# Patient Record
Sex: Female | Born: 1940 | Race: White | Hispanic: No | Smoking: Former smoker
Health system: Southern US, Community
[De-identification: ages and names within clinical notes are randomized; demographics above are authoritative.]

---

## 2017-09-09 ENCOUNTER — Ambulatory Visit (INDEPENDENT_AMBULATORY_CARE_PROVIDER_SITE_OTHER): Payer: Medicare Other | Admitting: Pulmonary Disease

## 2017-09-09 ENCOUNTER — Encounter: Payer: Self-pay | Admitting: Pulmonary Disease

## 2017-09-09 ENCOUNTER — Other Ambulatory Visit (INDEPENDENT_AMBULATORY_CARE_PROVIDER_SITE_OTHER): Payer: Medicare Other

## 2017-09-09 ENCOUNTER — Ambulatory Visit (INDEPENDENT_AMBULATORY_CARE_PROVIDER_SITE_OTHER)
Admission: RE | Admit: 2017-09-09 | Discharge: 2017-09-09 | Disposition: A | Discharge status: Home / Self Care | Payer: Medicare Other | Source: Ambulatory Visit | Attending: Pulmonary Disease | Admitting: Pulmonary Disease

## 2017-09-09 VITALS — BP 122/62 | HR 85 | Ht 67.5 in | Wt 168.0 lb

## 2017-09-09 DIAGNOSIS — R05 Cough: Secondary | ICD-10-CM

## 2017-09-09 DIAGNOSIS — J181 Lobar pneumonia, unspecified organism: Secondary | ICD-10-CM | POA: Diagnosis not present

## 2017-09-09 DIAGNOSIS — R059 Cough, unspecified: Secondary | ICD-10-CM

## 2017-09-09 LAB — CBC WITH DIFFERENTIAL/PLATELET
Basophils Absolute: 0.1 10*3/uL (ref 0.0–0.1)
Basophils Relative: 0.6 % (ref 0.0–3.0)
Eosinophils Absolute: 0.2 10*3/uL (ref 0.0–0.7)
Eosinophils Relative: 1.5 % (ref 0.0–5.0)
HCT: 45.1 % (ref 36.0–46.0)
Hemoglobin: 14.3 g/dL (ref 12.0–15.0)
Lymphocytes Relative: 8.9 % — ABNORMAL LOW (ref 12.0–46.0)
Lymphs Abs: 1.4 10*3/uL (ref 0.7–4.0)
MCHC: 31.6 g/dL (ref 30.0–36.0)
MCV: 87.4 fl (ref 78.0–100.0)
Monocytes Absolute: 1.6 10*3/uL — ABNORMAL HIGH (ref 0.1–1.0)
Monocytes Relative: 9.6 % (ref 3.0–12.0)
Neutro Abs: 12.9 10*3/uL — ABNORMAL HIGH (ref 1.4–7.7)
Neutrophils Relative %: 79.4 % — ABNORMAL HIGH (ref 43.0–77.0)
Platelets: 413 10*3/uL — ABNORMAL HIGH (ref 150.0–400.0)
RBC: 5.16 Mil/uL — ABNORMAL HIGH (ref 3.87–5.11)
RDW: 15.5 % (ref 11.5–15.5)
WBC: 16.2 10*3/uL — ABNORMAL HIGH (ref 4.0–10.5)

## 2017-09-09 LAB — NITRIC OXIDE: NITRIC OXIDE: 11

## 2017-09-09 NOTE — Addendum Note (Signed)
Addended by: Kandice HamsHOEFLER, Pheonix Wisby D on: 09/09/2017 10:46 AM   Modules accepted: Orders

## 2017-09-09 NOTE — Addendum Note (Signed)
Addended by: Sheran LuzEAST, Nyasia Baxley K on: 09/09/2017 11:06 AM   Modules accepted: Orders

## 2017-09-09 NOTE — Progress Notes (Addendum)
Scarlett PrestoFrances Carlson    [id number]    01/18/1941  Primary Care Physician:System, Pcp Not In  Referring Physician: No referring provider defined for this encounter.  Chief complaint: Follow-up after pneumonia  HPI: 77 year old with history of sinusitis, allergies.  She was hospitalized for 1 week at [facility] with hypoxia, pneumonia.  Discharged on 09/02/17.  As per the patient the hospitalization has been complicated with transient atrial fibrillation.  She was discharged on supplemental oxygen and told to follow-up with a pulmonologist.  History of seasonal allergies, sinusitis and was told she had asthma but never been on inhalers.  She has history of long-standing intermittent wheeze.  In office today she feels well with improved breathing.  Denies any cough, sputum production, fevers, chills.  She is on albuterol rescue inhaler which she is using 4 times a day as she was instructed to take this on a regular basis.  Pets: Dog, no birds or farm animals Occupation: Retired Catering managerbookkeeper Exposures: No known exposures, no mold at home Smoking history: 10-pack-year smoking history.  Quit in 1998 Travel History: Lived in [place] all her life.  [place] with her daughter.  Outpatient Encounter Medications as of 09/09/2017  Medication Sig  . diltiazem (CARDIZEM CD) 120 MG 24 hr capsule Take 120 mg by mouth daily.  Marland Kitchen. lisinopril (PRINIVIL,ZESTRIL) 10 MG tablet Take 10 mg by mouth daily.  Marland Kitchen. PARoxetine (PAXIL) 20 MG tablet Take 20 mg by mouth daily.   No facility-administered encounter medications on file as of 09/09/2017.     Allergies as of 09/09/2017 - Review Complete 09/09/2017  Allergen Reaction Noted  . Penicillins  09/09/2017    No past medical history on file.  History reviewed. No pertinent surgical history.  No family history on file.  Social History   Socioeconomic History  . Marital status: Divorced    Spouse name: Not  on file  . Number of children: Not on file  . Years of education: Not on file  . Highest education level: Not on file  Social Needs  . Financial resource strain: Not on file  . Food insecurity - worry: Not on file  . Food insecurity - inability: Not on file  . Transportation needs - medical: Not on file  . Transportation needs - non-medical: Not on file  Occupational History  . Not on file  Tobacco Use  . Smoking status: Not on file  Substance and Sexual Activity  . Alcohol use: Not on file  . Drug use: Not on file  . Sexual activity: Not on file  Other Topics Concern  . Not on file  Social History Narrative  . Not on file   Review of systems: Review of Systems  Constitutional: Negative for fever and chills.  HENT: Negative.   Eyes: Negative for blurred vision.  Respiratory: as per HPI  Cardiovascular: Negative for chest pain and palpitations.  Gastrointestinal: Negative for vomiting, diarrhea, blood per rectum. Genitourinary: Negative for dysuria, urgency, frequency and hematuria.  Musculoskeletal: Negative for myalgias, back pain and joint pain.  Skin: Negative for itching and rash.  Neurological: Negative for dizziness, tremors, focal weakness, seizures and loss of consciousness.  Endo/Heme/Allergies: Negative for environmental allergies.  Psychiatric/Behavioral: Negative for depression, suicidal ideas and hallucinations.  All other systems reviewed and are negative.  Physical Exam: Blood pressure 122/62, pulse 85, height 5' 7.5" (1.715 m), weight 168 lb (76.2 kg), SpO2 (!) 85 %.  Gen:      No acute distress HEENT:  EOMI, sclera anicteric Neck:     No masses; no thyromegaly Lungs:    Clear to auscultation bilaterally; normal respiratory effort CV:         Regular rate and rhythm; no murmurs Abd:      + bowel sounds; soft, non-tender; no palpable masses, no distension Ext:    Joint deformities of the hand. Skin:      Warm and dry; no rash Neuro: alert and oriented x  3 Psych: normal mood and affect  Data Reviewed: No records to review  FENO 09/09/17- 11  Assessment:  Follow-up after recent hospitalization for pneumonia Appears to be recovering well from recent pneumonia.  She is still hypoxic requiring supplemental oxygen We will obtain records from [facility] regional Check chest x-ray today, pulmonary function tests, FENO given her history of asthma and remote smoking history. Check CBC differential and blood allergy profile for evaluation of asthma, allergies.  I asked her to use albuterol on an as-needed basis and not scheduled.  She has joint deformities suggestive of rheumatoid arthritis although she denies any joint stiffness, swelling, pain.  Check ANA, rheumatoid factor, CCP.  Plan/Recommendations: -Chest x-ray -CBC differential, blood allergy profile, ANA, rheumatoid factor, CCP -Ambulatory oxygen assessment, FENO -Obtain records from [facility]. -Albuterol as needed  Chilton Greathouse MD [place] [facility] Pager [phone] 09/09/2017, 10:07 AM  CC: No ref. provider found

## 2017-09-09 NOTE — Patient Instructions (Signed)
We will get a chest x-ray today Check blood work including CBC differential, blood allergy profile, ANA with reflex, rheumatoid factor, CCP Schedule you for pulmonary function test We will check your oxygen levels on ambulation and FENO Follow-up in 1 month.

## 2017-09-12 LAB — CYCLIC CITRUL PEPTIDE ANTIBODY, IGG: Cyclic Citrullin Peptide Ab: <16 UNITS

## 2017-09-12 LAB — RHEUMATOID FACTOR: Rhuematoid fact SerPl-aCnc: 21 IU/mL — ABNORMAL HIGH (ref <14)

## 2017-09-23 ENCOUNTER — Telehealth: Payer: Self-pay | Admitting: Pulmonary Disease

## 2017-09-23 DIAGNOSIS — J181 Lobar pneumonia, unspecified organism: Secondary | ICD-10-CM

## 2017-09-23 NOTE — Telephone Encounter (Signed)
Okay to send an order for smaller POC.

## 2017-09-23 NOTE — Telephone Encounter (Signed)
Spoke with Inetta Fermoina, she is requesting a smaller POC for pt because the other tanks are too heavy. [facility]? PM please advise.

## 2017-09-23 NOTE — Telephone Encounter (Signed)
Order placed for pt to have a smaller poc. Nothing further needed at this current time.

## 2017-09-26 ENCOUNTER — Telehealth: Payer: Self-pay | Admitting: Pulmonary Disease

## 2017-09-26 DIAGNOSIS — J181 Lobar pneumonia, unspecified organism: Secondary | ICD-10-CM

## 2017-09-26 NOTE — Telephone Encounter (Signed)
[facility] [place] [facility] Per Barbara CowerJason, their last documentation is that pt is on 4lpm so a SimplyGo Mini would likely not be appropriate for pt  However, per the 1.25.19 phone note pt's emergency contact Sheralyn Boatmanoni stated that pt's liter flow was decreased from 4lpm to 2lpm at the 1.11.19 office visit.  Per the 1.11.19 office note pt was walked on 2lpm at 90-91% but there was no order placed to [facility] adjusting pt's liter flow.  Dr Isaiah SergeMannam, per that 1.25.19 phone note you did okay the smaller portable O2 but there was no request to you about decreasing patient's liter flow.  If this is okay will place new O2 order for the 2lpm with activity and request pt to be evaluated and titrated for best-fit POC.  Please advise, thank you.

## 2017-09-26 NOTE — Telephone Encounter (Signed)
Ok to place order for 2 lt with activity and titrate for POC. Thanks

## 2017-09-26 NOTE — Telephone Encounter (Signed)
Order placed for POC at 2L. Nothing further is needed.

## 2017-10-10 ENCOUNTER — Encounter: Payer: Self-pay | Admitting: Pulmonary Disease

## 2017-10-10 ENCOUNTER — Other Ambulatory Visit: Payer: Medicare Other

## 2017-10-10 ENCOUNTER — Ambulatory Visit (INDEPENDENT_AMBULATORY_CARE_PROVIDER_SITE_OTHER): Payer: Medicare Other | Admitting: Pulmonary Disease

## 2017-10-10 VITALS — BP 132/62 | HR 93 | Ht 65.0 in | Wt 168.0 lb

## 2017-10-10 DIAGNOSIS — R0602 Shortness of breath: Secondary | ICD-10-CM

## 2017-10-10 DIAGNOSIS — J449 Chronic obstructive pulmonary disease, unspecified: Secondary | ICD-10-CM | POA: Diagnosis not present

## 2017-10-10 DIAGNOSIS — J181 Lobar pneumonia, unspecified organism: Secondary | ICD-10-CM

## 2017-10-10 LAB — PULMONARY FUNCTION TEST
DL/VA % pred: 98 %
DL/VA: 4.84 ml/min/mmHg/L
DLCO COR % PRED: 52 %
DLCO COR: 13.33 ml/min/mmHg
DLCO unc % pred: 53 %
DLCO unc: 13.69 ml/min/mmHg
FEF 25-75 Post: 0.32 L/sec
FEF 25-75 Pre: 0.26 L/sec
FEF2575-%Change-Post: 21 %
FEF2575-%Pred-Post: 19 %
FEF2575-%Pred-Pre: 16 %
FEV1-%Change-Post: 9 %
FEV1-%Pred-Post: 35 %
FEV1-%Pred-Pre: 32 %
FEV1-POST: 0.76 L
FEV1-Pre: 0.69 L
FEV1FVC-%Change-Post: 5 %
FEV1FVC-%Pred-Pre: 67 %
FEV6-%Change-Post: 4 %
FEV6-%PRED-PRE: 49 %
FEV6-%Pred-Post: 51 %
FEV6-POST: 1.39 L
FEV6-Pre: 1.34 L
FEV6FVC-%Change-Post: 0 %
FEV6FVC-%PRED-POST: 102 %
FEV6FVC-%PRED-PRE: 101 %
FVC-%Change-Post: 3 %
FVC-%PRED-PRE: 48 %
FVC-%Pred-Post: 50 %
FVC-POST: 1.44 L
FVC-PRE: 1.39 L
POST FEV6/FVC RATIO: 97 %
PRE FEV1/FVC RATIO: 50 %
PRE FEV6/FVC RATIO: 96 %
Post FEV1/FVC ratio: 53 %

## 2017-10-10 MED ORDER — UMECLIDINIUM-VILANTEROL 62.5-25 MCG/INH IN AEPB: 1.0000 | INHALATION_SPRAY | Freq: Every day | RESPIRATORY_TRACT | 5 refills | Status: DC

## 2017-10-10 MED ORDER — UMECLIDINIUM-VILANTEROL 62.5-25 MCG/INH IN AEPB: 1.0000 | INHALATION_SPRAY | Freq: Every day | RESPIRATORY_TRACT | 0 refills | Status: AC

## 2017-10-10 NOTE — Progress Notes (Signed)
PFT done today. 

## 2017-10-10 NOTE — Addendum Note (Signed)
Addended by: Maxwell MarionBLANKENSHIP, Dovie Kapusta A on: 10/10/2017 03:03 PM   Modules accepted: Orders

## 2017-10-10 NOTE — Progress Notes (Signed)
Melanie PrestoFrances Webster    [id number]    24-Jul-1941  Primary Care Physician:[facility]  Referring Physician: No referring provider defined for this encounter.  Chief complaint: Follow-up after pneumonia  HPI: 77 year old with history of sinusitis, allergies.  She was hospitalized for 1 week at [facility] with hypoxia, pneumonia.  Discharged on 09/02/17.  As per the patient the hospitalization has been complicated with transient atrial fibrillation.  She was discharged on supplemental oxygen and told to follow-up with a pulmonologist.  History of seasonal allergies, sinusitis and was told she had asthma but never been on inhalers.  She has history of long-standing intermittent wheeze.  In office today she feels well with improved breathing.  Denies any cough, sputum production, fevers, chills.  She is on albuterol rescue inhaler which she is using 4 times a day as she was instructed to take this on a regular basis.  Pets: Dog, no birds or farm animals Occupation: Retired Catering managerbookkeeper Exposures: No known exposures, no mold at home Smoking history: 10-pack-year smoking history.  Quit in 1998 Travel History: Lived in [place] all her life.  [place] live with her daughter.  Interim history: No change in symptoms.  She has had a chest x-ray at her primary care office 2 weeks ago which showed clear lungs with no residual infiltrate.  Outpatient Encounter Medications as of 10/10/2017  Medication Sig  . diltiazem (CARDIZEM CD) 120 MG 24 hr capsule Take 120 mg by mouth daily.  Marland Kitchen. lisinopril (PRINIVIL,ZESTRIL) 10 MG tablet Take 10 mg by mouth daily.  Marland Kitchen. PARoxetine (PAXIL) 20 MG tablet Take 20 mg by mouth daily.  . VENTOLIN HFA 108 (90 Base) MCG/ACT inhaler 2 puffs every 4 (four) hours.   No facility-administered encounter medications on file as of 10/10/2017.     Allergies as of 10/10/2017 - Review Complete 10/10/2017  Allergen  Reaction Noted  . Penicillins  09/09/2017    No past medical history on file.  No past surgical history on file.  No family history on file.  Social History   Socioeconomic History  . Marital status: Divorced    Spouse name: Not on file  . Number of children: Not on file  . Years of education: Not on file  . Highest education level: Not on file  Social Needs  . Financial resource strain: Not on file  . Food insecurity - worry: Not on file  . Food insecurity - inability: Not on file  . Transportation needs - medical: Not on file  . Transportation needs - non-medical: Not on file  Occupational History  . Not on file  Tobacco Use  . Smoking status: Former Smoker    Types: Cigarettes    Last attempt to quit: 09/09/1996    Years since quitting: 21.0  . Smokeless tobacco: Never Used  Substance and Sexual Activity  . Alcohol use: Not on file  . Drug use: Not on file  . Sexual activity: Not on file  Other Topics Concern  . Not on file  Social History Narrative  . Not on file   Review of systems: Review of Systems  Constitutional: Negative for fever and chills.  HENT: Negative.   Eyes: Negative for blurred vision.  Respiratory: as per HPI  Cardiovascular: Negative for chest pain and palpitations.  Gastrointestinal: Negative for vomiting, diarrhea, blood per rectum. Genitourinary: Negative for dysuria, urgency, frequency and hematuria.  Musculoskeletal: Negative for  myalgias, back pain and joint pain.  Skin: Negative for itching and rash.  Neurological: Negative for dizziness, tremors, focal weakness, seizures and loss of consciousness.  Endo/Heme/Allergies: Negative for environmental allergies.  Psychiatric/Behavioral: Negative for depression, suicidal ideas and hallucinations.  All other systems reviewed and are negative.  Physical Exam: Blood pressure 122/62, pulse 85, height 5' 7.5" (1.715 m), weight 168 lb (76.2 kg), SpO2 (!) 85 %. Gen:      No acute  distress HEENT:  EOMI, sclera anicteric Neck:     No masses; no thyromegaly Lungs:    Clear to auscultation bilaterally; normal respiratory effort CV:         Regular rate and rhythm; no murmurs Abd:      + bowel sounds; soft, non-tender; no palpable masses, no distension Ext:    Joint deformities of the hand. Skin:      Warm and dry; no rash Neuro: alert and oriented x 3 Psych: normal mood and affect  Data Reviewed: CT chest 08/27/17- Patchy bilateral infiltrates consistent with pneumonia. Chest x-ray 09/09/17-partial clearing of right lower lobe infiltrate I have reviewed the images personally.  Chest x-ray [facility] 09/22/17-no infiltrate, clear lungs.  No acute cardiopulmonary disease.  FENO 09/09/17- 11  PFTs 10/10/17 FVC 1.44 [50%], FEV1 0.76 [35%], F/F 53, TLC 64%, DLCO 53% Severe obstruction, restriction in diffusion defect.  CBC 09/09/17-WBC 16.2, eosinophils 1.5%, absolute eosinophil count 200  Assessment:  Recent hospitalization for pneumonia Appears to be recovering well from recent pneumonia.  Chest x-ray shows clearing of left lung infiltrate  Severe COPD PFTs reviewed.  They show severe obstruction which is surprising as she does not have a significant smoking history Check alpha-1 antitrypsin levels and phenotype.  Start Anoro and use albuterol as needed  She has been told that she has asthma in the past but her symptoms are not very typical.  Check blood allergy profile. Recheck her oxygen levels on exertion.   She has joint deformities suggestive of rheumatoid arthritis although she denies any joint stiffness, swelling, pain.  There is mild elevation in rheumatoid factor which is likely nonspecific.  CCP is negative.  ANA is pending. We will continue to monitor  Plan/Recommendations: -Check blood allergy profile, ANA, A1AT levels and phenotype.  -Ambulatory oxygen assessment. -Albuterol as needed. Start anoro inhaler.   Chilton Greathouse MD [place] [facility] Pager [phone] 10/10/2017, 2:13 PM  CC: No ref. provider found

## 2017-10-10 NOTE — Patient Instructions (Addendum)
Check blood allergy profile, ANA with reflex, A1AT levels and phenotype  We will give samples of Anoro and give prescription. Continue albuterol as needed We will check your oxygen levels on exertion today Follow-up in 1-2 months.

## 2017-10-11 LAB — ANA W/REFLEX: Anti Nuclear Antibody(ANA): NEGATIVE

## 2017-10-13 LAB — RESPIRATORY ALLERGY PROFILE REGION II ~~LOC~~
Allergen, A. alternata, m6: <0.1 kU/L
Allergen, Cedar tree, t12: <0.1 kU/L
Allergen, Comm Silver Birch, t9: <0.1 kU/L
Allergen, D pternoyssinus,d7: <0.1 kU/L
Allergen, Mouse Urine Protein, e78: <0.1 kU/L
Allergen, Mulberry, t76: <0.1 kU/L
Allergen, P. notatum, m1: <0.1 kU/L
CLADOSPORIUM HERBARUM (M2) IGE: <0.1 kU/L
CLASS: 0
CLASS: 0
CLASS: 0
CLASS: 0
CLASS: 0
CLASS: 0
CLASS: 0
COMMON RAGWEED (SHORT) (W1) IGE: <0.1 kU/L
Cat Dander: <0.1 kU/L
Class: 0
Class: 0
Class: 0
Class: 0
Class: 0
Class: 0
Class: 0
Class: 0
Class: 0
Class: 0
Class: 0
Class: 0
Class: 0
Class: 0
Class: 0
Class: 0
Class: 0
D. farinae: <0.1 kU/L
IGE (IMMUNOGLOBULIN E), SERUM: 5 kU/L (ref <=114)
Pecan/Hickory Tree IgE: <0.1 kU/L
Rough Pigweed  IgE: <0.1 kU/L
Timothy Grass: <0.1 kU/L

## 2017-10-13 LAB — ALPHA-1 ANTITRYPSIN PHENOTYPE: A-1 Antitrypsin, Ser: 191 mg/dL (ref 83–199)

## 2017-10-13 LAB — INTERPRETATION:

## 2017-10-19 ENCOUNTER — Telehealth: Payer: Self-pay | Admitting: Pulmonary Disease

## 2017-10-19 MED ORDER — UMECLIDINIUM-VILANTEROL 62.5-25 MCG/INH IN AEPB: 1.0000 | INHALATION_SPRAY | Freq: Every day | RESPIRATORY_TRACT | 11 refills | Status: DC

## 2017-10-19 NOTE — Telephone Encounter (Signed)
Pt called concerned that her copay for Anoro is very high, requesting recs.  I called pt's pharmacy who verified that Anoro is covered by pt's insurance but she just has a very high copay for inhalers d/t needing to meet a deductible.  Called to make pt aware.  Pt requests application for financial assistance through manufacturer.  Form printed and filled out to best of my ability.  Form placed in PM's cubby with flags for him to sign.  Form will need to be mailed to patient once signed- verified PO box address on file.    Will route to [facility] to follow up on signatures from PM.

## 2017-10-21 NOTE — Telephone Encounter (Signed)
Forms have been signed by Dr. Isaiah SergeMannam to mailed to address on file- verified by pt's daughter. Pt's daughter, Inetta Fermoina is aware and voiced her understanding.  Nothing further is needed

## 2017-11-09 ENCOUNTER — Telehealth: Payer: Self-pay | Admitting: Pulmonary Disease

## 2017-11-09 NOTE — Telephone Encounter (Signed)
Attempted to contact pt. No answer, no option to leave a message. Will try back.  

## 2017-11-09 NOTE — Telephone Encounter (Signed)
Try bevespi 

## 2017-11-09 NOTE — Telephone Encounter (Signed)
Called and spoke with pt who states she has been paying $356 out of pocket and she stated her insurance only covers about $100.  Pt is wanting to switch to a cheaper inhaler than the Anoro due to cost.  Alternatives to Anoro are advair, breo, incruse, spiriva respimat, symbicort, stiolto, or bevespi.  Dr. Isaiah SergeMannam, please advise an inhaler you want us to switch pt to other than anoro. Thanks!

## 2017-11-10 MED ORDER — GLYCOPYRROLATE-FORMOTEROL 9-4.8 MCG/ACT IN AERO: 2.0000 | INHALATION_SPRAY | Freq: Two times a day (BID) | RESPIRATORY_TRACT | 5 refills | Status: DC

## 2017-11-10 NOTE — Telephone Encounter (Signed)
Patient daughter Inetta Fermoina returning call - she can be reached at [phone] -pr

## 2017-11-10 NOTE — Telephone Encounter (Signed)
Called patients daughter, unable to reach left message to give us a call back in regards to mothers medication.

## 2017-11-10 NOTE — Telephone Encounter (Signed)
Patient called back - she would like us to call her daughter back Inetta Fermo- Tina [phone]-pr

## 2017-11-10 NOTE — Telephone Encounter (Signed)
Spoke with pt's daughter, Inetta Fermoina per the pt's request. She is aware of the medication change. Rx has been sent in. Nothing further was needed.

## 2017-12-05 ENCOUNTER — Ambulatory Visit (INDEPENDENT_AMBULATORY_CARE_PROVIDER_SITE_OTHER): Payer: Medicare Other | Admitting: Pulmonary Disease

## 2017-12-05 ENCOUNTER — Encounter: Payer: Self-pay | Admitting: Pulmonary Disease

## 2017-12-05 VITALS — BP 130/76 | HR 70 | Ht 67.0 in | Wt 176.6 lb

## 2017-12-05 DIAGNOSIS — J449 Chronic obstructive pulmonary disease, unspecified: Secondary | ICD-10-CM | POA: Diagnosis not present

## 2017-12-05 MED ORDER — TIOTROPIUM BROMIDE-OLODATEROL 2.5-2.5 MCG/ACT IN AERS: 2.0000 | INHALATION_SPRAY | Freq: Every day | RESPIRATORY_TRACT | 5 refills | Status: AC

## 2017-12-05 NOTE — Progress Notes (Signed)
Melanie Webster    [id number]    [birth year]  Primary Care Physician:[facility]  [facility] [place] [address] [place], [place] [id number]  Chief complaint: Follow-up after pneumonia  HPI: 77 year old with history of sinusitis, allergies.  She was hospitalized for 1 week at [facility] with hypoxia, pneumonia.  Discharged on 09/02/17.  As per the patient the hospitalization has been complicated with transient atrial fibrillation.  She was discharged on supplemental oxygen and told to follow-up with a pulmonologist.  History of seasonal allergies, sinusitis and was told she had asthma but never been on inhalers.  She has history of long-standing intermittent wheeze.  In office today she feels well with improved breathing.  Denies any cough, sputum production, fevers, chills.  She is on albuterol rescue inhaler which she is using 4 times a day as she was instructed to take this on a regular basis.  Pets: Dog, no birds or farm animals Occupation: Retired Catering managerbookkeeper Exposures: No known exposures, no mold at home Smoking history: 10-pack-year smoking history.  Quit in 1998 Travel History: Lived in [place] all her life.  [place] to live with her daughter.  Interim history: Started on anoro at last visit however this was not covered by insurance hence switched to bevespi.  She feels that bevespi does not work as well for her Has chronic dyspnea on exertion, denies cough, sputum production, fevers, chills.  Outpatient Encounter Medications as of 12/05/2017  Medication Sig  . diltiazem (CARDIZEM CD) 120 MG 24 hr capsule Take 120 mg by mouth daily.  . Glycopyrrolate-Formoterol (BEVESPI AEROSPHERE) 9-4.8 MCG/ACT AERO Inhale 2 puffs into the lungs 2 (two) times daily.  Marland Kitchen. lisinopril (PRINIVIL,ZESTRIL) 10 MG tablet Take 10 mg by mouth daily.  Marland Kitchen. PARoxetine (PAXIL) 20 MG tablet Take  20 mg by mouth daily.  . VENTOLIN HFA 108 (90 Base) MCG/ACT inhaler 2 puffs every 4 (four) hours.  . [DISCONTINUED] umeclidinium-vilanterol (ANORO ELLIPTA) 62.5-25 MCG/INH AEPB Inhale 1 puff into the lungs daily.   No facility-administered encounter medications on file as of 12/05/2017.     Allergies as of 12/05/2017 - Review Complete 12/05/2017  Allergen Reaction Noted  . Penicillins  09/09/2017    No past medical history on file.  No past surgical history on file.  No family history on file.  Social History   Socioeconomic History  . Marital status: Divorced    Spouse name: Not on file  . Number of children: Not on file  . Years of education: Not on file  . Highest education level: Not on file  Occupational History  . Not on file  Social Needs  . Financial resource strain: Not on file  . Food insecurity:    Worry: Not on file    Inability: Not on file  . Transportation needs:    Medical: Not on file    Non-medical: Not on file  Tobacco Use  . Smoking status: Former Smoker    Types: Cigarettes    Last attempt to quit: 09/09/1996    Years since quitting: 21.2  . Smokeless tobacco: Never Used  Substance and Sexual Activity  . Alcohol use: Not on file  . Drug use: Not on file  . Sexual activity: Not on file  Lifestyle  . Physical activity:    Days per week: Not on file    Minutes per session: Not on file  .  Stress: Not on file  Relationships  . Social connections:    Talks on phone: Not on file    Gets together: Not on file    Attends religious service: Not on file    Active member of club or organization: Not on file    Attends meetings of clubs or organizations: Not on file    Relationship status: Not on file  . Intimate partner violence:    Fear of current or ex partner: Not on file    Emotionally abused: Not on file    Physically abused: Not on file    Forced sexual activity: Not on file  Other Topics Concern  . Not on file  Social History Narrative    . Not on file   Review of systems: Review of Systems  Constitutional: Negative for fever and chills.  HENT: Negative.   Eyes: Negative for blurred vision.  Respiratory: as per HPI  Cardiovascular: Negative for chest pain and palpitations.  Gastrointestinal: Negative for vomiting, diarrhea, blood per rectum. Genitourinary: Negative for dysuria, urgency, frequency and hematuria.  Musculoskeletal: Negative for myalgias, back pain and joint pain.  Skin: Negative for itching and rash.  Neurological: Negative for dizziness, tremors, focal weakness, seizures and loss of consciousness.  Endo/Heme/Allergies: Negative for environmental allergies.  Psychiatric/Behavioral: Negative for depression, suicidal ideas and hallucinations.  All other systems reviewed and are negative.  Physical Exam: Blood pressure 130/76, pulse 70, height 5\' 7"  (1.702 m), weight 176 lb 9.6 oz (80.1 kg), SpO2 95 %. Gen:      No acute distress HEENT:  EOMI, sclera anicteric Neck:     No masses; no thyromegaly Lungs:    Clear to auscultation bilaterally; normal respiratory effort CV:         Regular rate and rhythm; no murmurs Abd:      + bowel sounds; soft, non-tender; no palpable masses, no distension Ext:    No edema; adequate peripheral perfusion Skin:      Warm and dry; no rash Neuro: alert and oriented x 3 Psych: normal mood and affect  Data Reviewed: CT chest 08/27/17- Patchy bilateral infiltrates consistent with pneumonia. Chest x-ray 09/09/17-partial clearing of right lower lobe infiltrate Chest x-ray 09/21/17-clear lungs with no infiltrate. Reviewed the images personally.  Chest x-ray [facility]] 09/22/17-no infiltrate, clear lungs.  No acute cardiopulmonary disease.  FENO 09/09/17- 11  PFTs 10/10/17 FVC 1.44 [50%], FEV1 0.76 [35%], F/F 53, TLC 64%, DLCO 53% Severe obstruction, restriction in diffusion defect.  Labs CBC 09/09/17-WBC 16.2, eosinophils 1.5%, absolute eosinophil count 200 Blood allergy  profile 10/10/17-IgE 5, RAST panel negative  Alpha-1 antitrypsin 10/10/17-191, PIMM  Connective tissue serologies 10/10/17 ANA negative, CCP < 16.  Rheumatoid factor 21  Assessment:  Recent hospitalization for pneumonia Appears to be recovering well from recent pneumonia.  Chest x-ray shows clearing of left lung infiltrate  Severe COPD PFTs reviewed.  They show severe obstruction which is surprising as she does not have a significant smoking history Alpha-1 antitrypsin levels are normal She has been told that she has asthma in the past but her symptoms are not very typical.   Continue inhaler therapy with LABA/Lama combination.  Switch bevespi to stiolto she feels bevespi is not working as well.  She has joint deformities suggestive of rheumatoid arthritis although she denies any joint stiffness, swelling, pain.  There is mild elevation in rheumatoid factor which is likely nonspecific.  CCP and ANA is negative.  We will continue to monitor this.  Plan/Recommendations: - Stop bevespi.  Will start stiolto - Albuterol as needed.  Follow-up in 6 months.  Chilton Greathouse MD  Pulmonary and Critical Care Pager [phone] 12/05/2017, 4:25 PM  CC: [facility]

## 2017-12-05 NOTE — Patient Instructions (Addendum)
I am sorry that the bevespi inhaler is not working as well for you We will try a different inhaler called stiolto. Please let us know if this works better for you. Follow up in 6 months

## 2017-12-07 ENCOUNTER — Telehealth: Payer: Self-pay | Admitting: Pulmonary Disease

## 2017-12-07 NOTE — Telephone Encounter (Signed)
PA request received from [employer] [place] CMM Key: [id number] PA request has been sent to plan, and a determination is expected within 5 business days.   Routing to [place] for follow-up.

## 2017-12-09 NOTE — Telephone Encounter (Signed)
Received letter stating that the Jaynie CrumbleStiolto has been approved from 08/28/17 until 08/29/18 Letter faxed to [facility] in [place] to make them aware

## 2018-02-22 ENCOUNTER — Telehealth: Payer: Self-pay | Admitting: Pulmonary Disease

## 2018-02-22 NOTE — Telephone Encounter (Signed)
Called and spoke with pt's daughter regarding her handicap sticker forms. Advised her that the forms are in Dr. Shirlee MoreMannam's cubby to be completed and signed. They have not been signed at this time.  [facility].

## 2018-02-24 NOTE — Telephone Encounter (Signed)
Spoke with Melanie Webster still has form and will return once signed.

## 2018-02-27 NOTE — Telephone Encounter (Signed)
Called and spoke with pt's daughter-Tina regarding handicap forms. They are signed and completed by Dr. Isaiah SergeMannam.  Pt's daughter requested the forms be mailed to her this week. Placed forms in the mail to [address] [place] [place] [phone] today. Nothing further needed.

## 2018-05-23 ENCOUNTER — Telehealth: Payer: Self-pay | Admitting: Pulmonary Disease

## 2018-05-23 NOTE — Telephone Encounter (Signed)
Received call from [facility] Patient seen there for COPD exacerbation.  She had a CT scan that showed a new right lower lobe 16 mm spiculated nodule that will need follow-up  We will make clinic visit ASAP evaluate I have requested the ED physician to ensure she has the CT scan on a disc for us to review.  Chilton GreathousePraveen Gresia Isidoro MD [place] [facility] 05/23/2018, 2:26 PM

## 2018-05-23 NOTE — Telephone Encounter (Signed)
Called and spoke with patients daughter, patient has been scheduled for 05/29/18 at 10:00.

## 2018-05-26 ENCOUNTER — Telehealth: Payer: Self-pay

## 2018-05-26 NOTE — Telephone Encounter (Signed)
Pt has been scheduled for OV on 05/29/18 with Dr. Isaiah Serge. nothing further is needed.

## 2018-05-29 ENCOUNTER — Encounter: Payer: Self-pay | Admitting: Pulmonary Disease

## 2018-05-29 ENCOUNTER — Ambulatory Visit (INDEPENDENT_AMBULATORY_CARE_PROVIDER_SITE_OTHER): Payer: Medicare Other | Admitting: Pulmonary Disease

## 2018-05-29 DIAGNOSIS — Z23 Encounter for immunization: Secondary | ICD-10-CM | POA: Diagnosis not present

## 2018-05-29 DIAGNOSIS — R911 Solitary pulmonary nodule: Secondary | ICD-10-CM

## 2018-05-29 NOTE — Addendum Note (Signed)
Addended by: Maxwell Marion A on: 05/29/2018 10:38 AM   Modules accepted: Orders

## 2018-05-29 NOTE — Patient Instructions (Addendum)
I have reviewed your CT scan which shows a new lung nodule in the right side We will get a PET scan for further evaluation We will give a flu shot today We will assess you for portable concentrator Follow-up in 2 weeks for review of scan.

## 2018-05-29 NOTE — Progress Notes (Signed)
Melanie Webster    [id number]    [birth year]  Primary Care Physician:[facility]  Referring Physician: [facility], [place] [address] [place], [place] [address]  Chief complaint: Follow-up after pneumonia  HPI: 77 year old with history of sinusitis, allergies.  She was hospitalized for 1 week at [facility] with hypoxia, pneumonia.  Discharged on 09/02/17.  As per the patient the hospitalization has been complicated with transient atrial fibrillation.  She was discharged on supplemental oxygen and told to follow-up with a pulmonologist.  History of seasonal allergies, sinusitis and was told she had asthma but never been on inhalers.  She has history of long-standing intermittent wheeze.  In office today she feels well with improved breathing.  Denies any cough, sputum production, fevers, chills.  She is on albuterol rescue inhaler which she is using 4 times a day as she was instructed to take this on a regular basis.  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  Quit in 1998 Travel History: Lived in [place] all her life.  Moved to [facility] 2018 to live with her daughter.  Interim history: Started on anoro at last visit however this was not covered by insurance hence switched to bevespi.  She feels that bevespi does not work as well for her  At last visit this was changed to stiolto work better  Seen in the ED on 9/24 with COPD exacerbation treated with Z-Pak and prednisone CT chest at that time showed a new right lower lobe nodule  Returns to clinic for further evaluation.  States that her breathing is back to baseline after her ED visit Has chronic dyspnea on exertion, denies any cough, sputum production, fevers, chills.   Outpatient Encounter Medications as of 05/29/2018  Medication  Sig  . diltiazem (CARDIZEM CD) 120 MG 24 hr capsule Take 120 mg by mouth daily.  . Glycopyrrolate-Formoterol (BEVESPI AEROSPHERE) 9-4.8 MCG/ACT AERO Inhale 2 puffs into the lungs 2 (two) times daily.  Marland Kitchen lisinopril (PRINIVIL,ZESTRIL) 10 MG tablet Take 10 mg by mouth daily.  Marland Kitchen PARoxetine (PAXIL) 20 MG tablet Take 20 mg by mouth daily.  . Tiotropium Bromide-Olodaterol (STIOLTO RESPIMAT) 2.5-2.5 MCG/ACT AERS Inhale 2 puffs into the lungs daily.  . VENTOLIN HFA 108 (90 Base) MCG/ACT inhaler 2 puffs every 4 (four) hours.   No facility-administered encounter medications on file as of 05/29/2018.    Physical Exam: Blood pressure 128/76, pulse 78, height 5\' 7"  (1.702 m), weight 172 lb (78 kg), SpO2 92 %. Gen:      No acute distress HEENT:  EOMI, sclera anicteric Neck:     No masses; no thyromegaly Lungs:    Clear to auscultation bilaterally; normal respiratory effort CV:         Regular rate and rhythm; no murmurs Abd:      + bowel sounds; soft, non-tender; no palpable masses, no distension Ext:    No edema; adequate peripheral perfusion Skin:      Warm and dry; no rash Neuro: alert and oriented x 3 Psych: normal mood and affect  Data Reviewed: CT chest 08/27/17- Patchy bilateral infiltrates consistent with pneumonia. Chest x-ray 09/09/17-partial clearing of right lower lobe infiltrate Chest x-ray 09/21/17-clear lungs with no infiltrate. Chest x-ray [facility]] 09/22/17-no infiltrate, clear lungs.  No acute cardiopulmonary disease. CT chest 05/23/2018- spiculated right lower lobe nodule,  16 mm.  Scattered interstitial groundglass opacities in the upper lobe. I have reviewed the images personally.  PFTs 10/10/17 FVC 1.44 [50%], FEV1 0.76 [35%], F/F 53, TLC 64%, DLCO 53% Severe obstruction, restriction in diffusion defect.  FENO 09/09/17- 11  Labs CBC 09/09/17-WBC 16.2, eosinophils 1.5%, absolute eosinophil count 200 Blood allergy profile 10/10/17-IgE 5, RAST panel negative  Alpha-1  antitrypsin 10/10/17-191, PIMM  Connective tissue serologies 10/10/17 ANA negative, CCP < 16.  Rheumatoid factor 21  Assessment:  Right lower lobe nodule I reviewed the CT scan which shows spiculated right lower lobe nodule which is new since December 2018.  This is concerning for malignancy We will get a PET scan for further evaluation  She is clear that she does not want chemotherapy.  I cautioned that this is to early to discuss this.  We reviewed options for biopsy and treatment after PET scan.  Severe COPD PFTs reviewed.  They show severe obstruction which is surprising as she does not have a significant smoking history Alpha-1 antitrypsin levels are normal She has been told that she has asthma in the past but her symptoms are not very typical.   Continue inhaler therapy with LABA/Lama combination with Stiolto.  She has joint deformities suggestive of rheumatoid arthritis although she denies any joint stiffness, swelling, pain.  There is mild elevation in rheumatoid factor which is likely nonspecific.  CCP and ANA is negative.  We will continue to monitor this.  Plan/Recommendations: - PET Scan - Continue Stiolto.  Follow-up in 2 weeks to review scans . More then 1/2 the time of the 40 min visit was spent in counseling and/or coordination of care with the patient and family.  Chilton Greathouse MD [place] [facility] Pager [phone] 05/29/2018, 10:11 AM  CC: [facility]

## 2018-06-05 ENCOUNTER — Encounter (HOSPITAL_COMMUNITY): Payer: Self-pay | Admitting: Radiology

## 2018-06-05 ENCOUNTER — Ambulatory Visit (HOSPITAL_COMMUNITY)
Admission: RE | Admit: 2018-06-05 | Discharge: 2018-06-05 | Disposition: A | Discharge status: Home / Self Care | Payer: Medicare Other | Source: Ambulatory Visit | Attending: Pulmonary Disease | Admitting: Pulmonary Disease

## 2018-06-05 ENCOUNTER — Other Ambulatory Visit: Payer: Self-pay | Admitting: Pulmonary Disease

## 2018-06-05 DIAGNOSIS — I700 Atherosclerosis of aorta: Secondary | ICD-10-CM | POA: Diagnosis not present

## 2018-06-05 DIAGNOSIS — R911 Solitary pulmonary nodule: Secondary | ICD-10-CM

## 2018-06-05 DIAGNOSIS — R918 Other nonspecific abnormal finding of lung field: Secondary | ICD-10-CM

## 2018-06-05 LAB — GLUCOSE, CAPILLARY: GLUCOSE-CAPILLARY: 112 mg/dL — AB (ref 70–99)

## 2018-06-05 MED ORDER — FLUDEOXYGLUCOSE F - 18 (FDG) INJECTION
8.5000 | Freq: Once | INTRAVENOUS | Status: AC | PRN
  Administered 2018-06-05: 8.5 via INTRAVENOUS

## 2018-06-12 ENCOUNTER — Ambulatory Visit: Payer: Medicare Other | Admitting: Pulmonary Disease

## 2018-09-04 ENCOUNTER — Telehealth: Payer: Self-pay | Admitting: Pulmonary Disease

## 2018-09-04 NOTE — Telephone Encounter (Signed)
CT order printed, signed, and [facility] below listed fax [facility].  Larita FifeLynn at [facility] is aware.  Nothing further needed.

## 2019-02-10 ENCOUNTER — Telehealth: Payer: Self-pay | Admitting: Pulmonary Disease

## 2019-02-10 DIAGNOSIS — R911 Solitary pulmonary nodule: Secondary | ICD-10-CM

## 2019-02-10 NOTE — Telephone Encounter (Signed)
CT scan report from [place] dated 09/05/18 Nodular Area of Consolidation in the Right Lower Lobe Has Almost Completely Resolved.  Stable 16 Mm Subpleural Right Lower Lobe Pulmonary Nodule. Patchy Groundglass Opacities in Both Lungs Have Completely Resolved Emphysematous changes  Schedule regular follow up in clinic for COPD Order follow up CT without contrast in 6 months for lung nodule  Marshell Garfinkel MD  Pulmonary and Critical Care 02/10/2019, 1:19 PM

## 2019-02-13 NOTE — Addendum Note (Signed)
Addended by: Parke Poisson E on: 02/13/2019 09:48 AM   Modules accepted: Orders

## 2019-02-13 NOTE — Telephone Encounter (Signed)
Called spoke with patient's daughter Otila Kluver (dpr on file) and discussed CT results from [place] as stated by Dr Vaughan Browner Appt scheduled in office for 6.29.2020 @ 1615 with Dr. Vaughan Browner CT Chest w/o contrast ordered for 6 months out  Nothing further needed; will sign off

## 2019-02-26 ENCOUNTER — Encounter: Payer: Self-pay | Admitting: Pulmonary Disease

## 2019-02-26 ENCOUNTER — Other Ambulatory Visit: Payer: Self-pay

## 2019-02-26 ENCOUNTER — Ambulatory Visit (INDEPENDENT_AMBULATORY_CARE_PROVIDER_SITE_OTHER): Payer: Medicare Other | Admitting: Pulmonary Disease

## 2019-02-26 VITALS — BP 142/64 | HR 83 | Temp 98.2°F | Ht 66.0 in | Wt 162.4 lb

## 2019-02-26 DIAGNOSIS — R911 Solitary pulmonary nodule: Secondary | ICD-10-CM

## 2019-02-26 MED ORDER — STIOLTO RESPIMAT 2.5-2.5 MCG/ACT IN AERS: 2.0000 | INHALATION_SPRAY | Freq: Every day | RESPIRATORY_TRACT | 0 refills | Status: DC

## 2019-02-26 NOTE — Progress Notes (Signed)
Melanie PrestoFrances Bowmer    [id number]    08-Sep-1940  Primary Care Physician:[facility]  [facility] [place] [address] [place],  [place] [id number]  Chief complaint: Follow-up for COPD, lung nodule  HPI: 78 year old with history of sinusitis, allergies, COPD, lung nodule.    She was hospitalized for 1 week [facility] with hypoxia, pneumonia.  Discharged on 09/02/17.  As per the patient the hospitalization has been complicated with transient atrial fibrillation.  History of seasonal allergies, sinusitis.  She has history of long-standing intermittent wheeze.  Tried on multiple inhalers.  Anoro was not covered by insurance.  Bevespi did not work well for her.  Currently on Stiolto  Noted to have a right lower lobe lung nodule in September 2019 which is followed up with PET scan and CT which showed improving size of nodule..  Pets: Dog, no birds or farm animals Occupation: Retired Catering managerbookkeeper Exposures: No known exposures, no mold at home Smoking history: 10-pack-year smoking history.  Quit in 1998 Travel History: Lived in [place] all her life.  [place] [facility] [place] to live with her daughter.  Interim history: Continues on Stiolto.  However she is unable to afford it due to high cost On supplemental oxygen.  Outpatient Encounter Medications as of 02/26/2019  Medication Sig  . diltiazem (CARDIZEM CD) 120 MG 24 hr capsule Take 120 mg by mouth daily.  Marland Kitchen. lisinopril (PRINIVIL,ZESTRIL) 10 MG tablet Take 10 mg by mouth daily.  Marland Kitchen. PARoxetine (PAXIL) 20 MG tablet Take 20 mg by mouth daily.  . Tiotropium Bromide-Olodaterol (STIOLTO RESPIMAT) 2.5-2.5 MCG/ACT AERS Inhale 2 puffs into the lungs daily.  . VENTOLIN HFA 108 (90 Base) MCG/ACT inhaler 2 puffs every 4 (four) hours.  . [DISCONTINUED] Glycopyrrolate-Formoterol (BEVESPI AEROSPHERE) 9-4.8 MCG/ACT AERO Inhale 2 puffs into the lungs 2 (two)  times daily.   No facility-administered encounter medications on file as of 02/26/2019.    Physical Exam: Blood pressure 128/76, pulse 78, height 5\' 7"  (1.702 m), weight 172 lb (78 kg), SpO2 92 %. Gen:      No acute distress HEENT:  EOMI, sclera anicteric Neck:     No masses; no thyromegaly Lungs:    Clear to auscultation bilaterally; normal respiratory effort CV:         Regular rate and rhythm; no murmurs Abd:      + bowel sounds; soft, non-tender; no palpable masses, no distension Ext:    No edema; adequate peripheral perfusion Skin:      Warm and dry; no rash Neuro: alert and oriented x 3 Psych: normal mood and affect  Data Reviewed: CT chest 08/27/17- Patchy bilateral infiltrates consistent with pneumonia. Chest x-ray 09/09/17-partial clearing of right lower lobe infiltrate Chest x-ray 09/21/17-clear lungs with no infiltrate. Chest x-ray [facility] 09/22/17-no infiltrate, clear lungs.  No acute cardiopulmonary disease. CT chest 05/23/2018- spiculated right lower lobe nodule, 16 mm.  Scattered interstitial groundglass opacities in the upper lobe. PET scan 06/05/2018- nodular consolidation in the right lower lobe is  smaller with borderline hypermetabolism.  CT scan [facility] 09/05/18- Nodular Area of Consolidation in the Right Lower Lobe Has Almost Completely Resolved.  Stable 16 Mm Subpleural Right Lower Lobe Pulmonary Nodule. Patchy Groundglass Opacities in Both Lungs Have Completely Resolved Emphysematous changes  PFTs 10/10/17 FVC 1.44 [50%], FEV1 0.76 [35%], F/F 53, TLC 64%, DLCO 53% Severe obstruction, restriction in diffusion defect.  FENO 09/09/17-  11  Labs CBC 09/09/17-WBC 16.2, eosinophils 1.5%, absolute eosinophil count 200 Blood allergy profile 10/10/17-IgE 5, RAST panel negative  Alpha-1 antitrypsin 10/10/17-191, PIMM  Connective tissue serologies 10/10/17 ANA negative, CCP < 16.  Rheumatoid factor 21  Assessment:  Right lower lobe nodule PET scan and follow-up CT  reviewed with improving lung nodule and infiltrates consistent with pneumonia, inflammation.  Follow-up CT in 6 months.   Severe COPD PFTs reviewed.  They show severe obstruction which is surprising as she does not have a significant smoking history Alpha-1 antitrypsin levels are normal She has been told that she has asthma in the past but her symptoms are not very typical.   Continue inhaler therapy with LABA/Lama combination  She has joint deformities suggestive of rheumatoid arthritis although she denies any joint stiffness, swelling, pain.  There is mild elevation in rheumatoid factor which is likely nonspecific.  CCP and ANA is negative.  We will continue to monitor this.  Plan/Recommendations: - Continue stiolto - CT in 6 months  Marshell Garfinkel MD [place] [facility] Pager [phone] 02/26/2019, 4:50 PM  CC: [place]*

## 2019-02-26 NOTE — Patient Instructions (Signed)
Continue Stiolto inhaler.  Will give samples to help with the cost Please check with your insurance company to see if any alternative inhalers are cheaper  Follow-up CT in 6 months Follow-up in clinic after CT scan

## 2019-08-15 ENCOUNTER — Other Ambulatory Visit: Payer: Self-pay

## 2019-08-15 ENCOUNTER — Ambulatory Visit (HOSPITAL_BASED_OUTPATIENT_CLINIC_OR_DEPARTMENT_OTHER)
Admission: RE | Admit: 2019-08-15 | Discharge: 2019-08-15 | Disposition: A | Discharge status: Home / Self Care | Payer: Medicare Other | Source: Ambulatory Visit | Attending: Pulmonary Disease | Admitting: Pulmonary Disease

## 2019-08-15 DIAGNOSIS — R911 Solitary pulmonary nodule: Secondary | ICD-10-CM | POA: Diagnosis not present

## 2019-08-17 ENCOUNTER — Ambulatory Visit (INDEPENDENT_AMBULATORY_CARE_PROVIDER_SITE_OTHER): Payer: Medicare Other | Admitting: Pulmonary Disease

## 2019-08-17 ENCOUNTER — Encounter: Payer: Self-pay | Admitting: Pulmonary Disease

## 2019-08-17 ENCOUNTER — Other Ambulatory Visit: Payer: Self-pay

## 2019-08-17 VITALS — BP 120/64 | HR 79 | Temp 97.7°F | Ht 67.0 in | Wt 168.4 lb

## 2019-08-17 DIAGNOSIS — J449 Chronic obstructive pulmonary disease, unspecified: Secondary | ICD-10-CM | POA: Diagnosis not present

## 2019-08-17 DIAGNOSIS — J181 Lobar pneumonia, unspecified organism: Secondary | ICD-10-CM | POA: Diagnosis not present

## 2019-08-17 MED ORDER — STIOLTO RESPIMAT 2.5-2.5 MCG/ACT IN AERS: 2.0000 | INHALATION_SPRAY | Freq: Every day | RESPIRATORY_TRACT | 0 refills | Status: AC

## 2019-08-17 NOTE — Progress Notes (Signed)
Melanie Webster    [id number]    1941/06/07  Primary Care Physician:[facility]  Referring Physician: [facility], [place] [address] [place],  [place] [address]  Chief complaint: Follow-up for COPD, lung nodule  HPI: 78 year old with history of sinusitis, allergies, COPD, lung nodule.    She was hospitalized for 1 week at [facility] with hypoxia, pneumonia.  Discharged on 09/02/17.  As per the patient the hospitalization has been complicated with transient atrial fibrillation.  History of seasonal allergies, sinusitis.  She has history of long-standing intermittent wheeze.  Tried on multiple inhalers.  Anoro was not covered by insurance.  Bevespi did not work well for her.  Currently on Stiolto  Noted to have a right lower lobe lung nodule in September 2019 which is followed up with PET scan and CT which showed improving size of nodule..  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  Quit in 1998 Travel History: Lived in [place] all her life.  Moved to [facility] 2018 to live with her daughter.  Interim history: Continues on Stiolto.  However she is unable to afford it due to high cost On supplemental oxygen.  Outpatient Encounter Medications as of 08/17/2019  Medication Sig  . diltiazem (CARDIZEM CD) 120 MG 24 hr capsule Take 120 mg by mouth daily.  Marland Kitchen gabapentin (NEURONTIN) 300 MG capsule Take 300 mg by mouth at bedtime.  Marland Kitchen lisinopril (PRINIVIL,ZESTRIL) 10 MG tablet Take 10 mg by mouth daily.  Marland Kitchen PARoxetine (PAXIL) 40 MG tablet Take 40 mg by mouth every morning.   . Tiotropium Bromide-Olodaterol (STIOLTO RESPIMAT) 2.5-2.5 MCG/ACT AERS Inhale 2 puffs into the lungs daily.  . Tiotropium Bromide-Olodaterol (STIOLTO RESPIMAT) 2.5-2.5 MCG/ACT AERS Inhale 2 puffs into the lungs daily.  .  VENTOLIN HFA 108 (90 Base) MCG/ACT inhaler 2 puffs every 4 (four) hours.  . [DISCONTINUED] PARoxetine (PAXIL) 20 MG tablet Take 20 mg by mouth daily.   No facility-administered encounter medications on file as of 08/17/2019.   Physical Exam: Blood pressure 128/76, pulse 78, height 5\' 7"  (1.702 m), weight 172 lb (78 kg), SpO2 92 %. Gen:      No acute distress HEENT:  EOMI, sclera anicteric Neck:     No masses; no thyromegaly Lungs:    Clear to auscultation bilaterally; normal respiratory effort CV:         Regular rate and rhythm; no murmurs Abd:      + bowel sounds; soft, non-tender; no palpable masses, no distension Ext:    No edema; adequate peripheral perfusion Skin:      Warm and dry; no rash Neuro: alert and oriented x 3 Psych: normal mood and affect  Data Reviewed: CT chest 08/27/17- Patchy bilateral infiltrates consistent with pneumonia. Chest x-ray 09/09/17-partial clearing of right lower lobe infiltrate Chest x-ray 09/21/17-clear lungs with no infiltrate. Chest x-ray [facility] 09/22/17-no infiltrate, clear lungs.  No acute cardiopulmonary disease. CT chest 05/23/2018- spiculated right lower lobe nodule, 16 mm.  Scattered interstitial groundglass opacities in the upper lobe. PET scan 06/05/2018- nodular consolidation in the right lower lobe is  smaller with borderline hypermetabolism.  CT scan 08/05/2018) 09/05/18- Nodular Area of Consolidation in the Right Lower Lobe Has Almost Completely Resolved.  Stable 16 Mm Subpleural Right Lower Lobe Pulmonary Nodule. Patchy Groundglass Opacities in Both Lungs Have Completely Resolved  Emphysematous changes  CT chest 08/15/2019-complete resolution of previously seen consolidation in the right lower lobe.  Subpleural nodular consolidation  PFTs 10/10/17 FVC 1.44 [50%], FEV1 0.76 [35%], F/F 53, TLC 64%, DLCO 53% Severe obstruction, restriction in diffusion defect.  FENO 09/09/17- 11  Labs CBC 09/09/17-WBC 16.2, eosinophils 1.5%, absolute  eosinophil count 200 Blood allergy profile 10/10/17-IgE 5, RAST panel negative  Alpha-1 antitrypsin 10/10/17-191, PIMM  Connective tissue serologies 10/10/17 ANA negative, CCP < 16.  Rheumatoid factor 21  Assessment:  Right lower lobe nodule PET scan and follow-up CT reviewed with improving lung nodule and infiltrates consistent with pneumonia, inflammation. Latest scan shows near complete of the infiltrates Order follow-up CT in 12 months  Severe COPD PFTs reviewed.  They show severe obstruction which is surprising as she does not have a significant smoking history Alpha-1 antitrypsin levels are normal She has been told that she has asthma in the past but her symptoms are not very typical.   Continue inhaler therapy with LABA/Lama combination  She has joint deformities suggestive of rheumatoid arthritis although she denies any joint stiffness, swelling, pain.  There is mild elevation in rheumatoid factor which is likely nonspecific.  CCP and ANA is negative.  We will continue to monitor this.  Plan/Recommendations: - Continue stiolto.  Pharmacy referral for inhaler management - CT in 12 months  Marshell Garfinkel MD [place] [facility] Pager [phone] 08/17/2019, 12:53 PM  CC: [place]*

## 2019-08-17 NOTE — Patient Instructions (Addendum)
Your CT scan looks much better with improving lung opacities We will get a follow-up CT chest without contrast in 12 months We will give you samples of Stiolto inhaler Referral to pharmacy for inhaler training and patient assistance  Follow-up in 6 months

## 2020-03-12 ENCOUNTER — Ambulatory Visit (INDEPENDENT_AMBULATORY_CARE_PROVIDER_SITE_OTHER): Payer: Medicare Other | Admitting: Pulmonary Disease

## 2020-03-12 ENCOUNTER — Encounter: Payer: Self-pay | Admitting: Pulmonary Disease

## 2020-03-12 ENCOUNTER — Other Ambulatory Visit: Payer: Self-pay

## 2020-03-12 VITALS — BP 132/60 | HR 82 | Ht 66.0 in | Wt 165.6 lb

## 2020-03-12 DIAGNOSIS — J449 Chronic obstructive pulmonary disease, unspecified: Secondary | ICD-10-CM

## 2020-03-12 MED ORDER — STIOLTO RESPIMAT 2.5-2.5 MCG/ACT IN AERS: 2.0000 | INHALATION_SPRAY | Freq: Every day | RESPIRATORY_TRACT | 0 refills | Status: AC

## 2020-03-12 NOTE — Patient Instructions (Signed)
We will get a chest x-ray today Continue the Stiolto.  We will give you some samples of inhaler Start using the nebulizer for shortness of breath I will check with pharmacy to see if there are any cheaper alternatives  Return to clinic in 3 months.

## 2020-03-12 NOTE — Progress Notes (Signed)
Melanie Webster    [id number]    1941/08/12  Primary Care Physician:[facility]  Referring Physician: [facility], [place] [address] [place],  [place] [address]  Chief complaint: Follow-up for COPD, lung nodule  HPI: 79 year old with history of sinusitis, allergies, COPD, lung nodule.    She was hospitalized for 1 week at [facility] hospital with hypoxia, pneumonia.  Discharged on 09/02/17.  As per the patient the hospitalization has been complicated with transient atrial fibrillation.  History of seasonal allergies, sinusitis.  She has history of long-standing intermittent wheeze.  Tried on multiple inhalers.  Anoro was not covered by insurance.  Bevespi did not work well for her.  Currently on Stiolto  Noted to have a right lower lobe lung nodule in September 2019 which is followed up with PET scan and CT which showed improving size of nodule..  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  Quit in 1998 Travel History: Lived in [place] all her life.  Moved to [facility] 2018 to live with her daughter.  Interim history: Continues on Stiolto.  However she is unable to afford it due to high cost.  She is using samples On supplemental oxygen.  Complains of increasing dyspnea, chest tightness.  No cough, sputum production Recently had a heart catheterization at [facility] which did not show any abnormalities  Outpatient Encounter Medications as of 03/12/2020  Medication Sig   diltiazem (CARDIZEM CD) 120 MG 24 hr capsule Take 120 mg by mouth daily.   gabapentin (NEURONTIN) 300 MG capsule Take 300 mg by mouth at bedtime.   lisinopril (PRINIVIL,ZESTRIL) 10 MG tablet Take 10 mg by mouth daily.   PARoxetine (PAXIL) 40 MG tablet Take 40 mg by mouth every morning.    Tiotropium Bromide-Olodaterol  (STIOLTO RESPIMAT) 2.5-2.5 MCG/ACT AERS Inhale 2 puffs into the lungs daily.   Tiotropium Bromide-Olodaterol (STIOLTO RESPIMAT) 2.5-2.5 MCG/ACT AERS Inhale 2 puffs into the lungs daily.   VENTOLIN HFA 108 (90 Base) MCG/ACT inhaler 2 puffs every 4 (four) hours.   No facility-administered encounter medications on file as of 03/12/2020.   Physical Exam: Blood pressure 132/60, pulse 82, height 5\' 6"  (1.676 m), weight 165 lb 9.6 oz (75.1 kg), SpO2 95 %. Gen:      No acute distress HEENT:  EOMI, sclera anicteric Neck:     No masses; no thyromegaly Lungs:    Clear to auscultation bilaterally; normal respiratory effort CV:         Regular rate and rhythm; no murmurs Abd:      + bowel sounds; soft, non-tender; no palpable masses, no distension Ext:    No edema; adequate peripheral perfusion Skin:      Warm and dry; no rash Neuro: alert and oriented x 3 Psych: normal mood and affect  Data Reviewed: CT chest 08/27/17- Patchy bilateral infiltrates consistent with pneumonia. Chest x-ray 09/09/17-partial clearing of right lower lobe infiltrate Chest x-ray 09/21/17-clear lungs with no infiltrate. Chest x-ray [facility]] 09/22/17-no infiltrate, clear lungs.  No acute cardiopulmonary disease. CT chest 05/23/2018- spiculated right lower lobe nodule, 16 mm.  Scattered interstitial groundglass opacities in the upper lobe. PET scan 06/05/2018- nodular consolidation in the right lower lobe is  smaller with borderline hypermetabolism.  CT scan 08/05/2018) 09/05/18- Nodular Area of Consolidation in the Right Lower Lobe Has Almost Completely Resolved.  Stable 16 Mm Subpleural Right Lower Lobe Pulmonary Nodule. Patchy Groundglass Opacities in Both Lungs Have Completely Resolved Emphysematous changes  CT chest 08/15/2019-complete resolution of previously seen consolidation in the right lower lobe.  Subpleural nodular consolidation  PFTs 10/10/17 FVC 1.44 [50%], FEV1 0.76 [35%], F/F 53, TLC 64%, DLCO 53% Severe  obstruction, restriction in diffusion defect.  FENO 09/09/17- 11  Labs CBC 09/09/17-WBC 16.2, eosinophils 1.5%, absolute eosinophil count 200 Blood allergy profile 10/10/17-IgE 5, RAST panel negative  Alpha-1 antitrypsin 10/10/17-191, PIMM  Connective tissue serologies 10/10/17 ANA negative, CCP < 16.  Rheumatoid factor 21  Cardiac: Heart catheterization 03/04/2020 1. Mild, nonobstructive coronary artery disease.  2. Normal LVEDP, 12 mmHg.   Assessment:  Right lower lobe nodule PET scan and follow-up CT reviewed with improving lung nodule and infiltrates consistent with pneumonia, inflammation. Latest scan shows near complete of the infiltrates Order follow-up CT in 12 months  Severe COPD PFTs reviewed.  They show severe obstruction which is surprising as she does not have a significant smoking history Alpha-1 antitrypsin levels are normal She has been told that she has asthma in the past but her symptoms are not very typical.    She has joint deformities suggestive of rheumatoid arthritis although she denies any joint stiffness, swelling, pain.  There is mild elevation in rheumatoid factor which is likely nonspecific.  CCP and ANA is negative.  We will continue to monitor this.  With increasing dyspnea I would like her to be on triple therapy however she is limited by insurance coverage Check with pharmacy regarding which inhalers are affordable Chest x-ray today  Plan/Recommendations: - Continue stiolto.  Pharmacy referral for inhaler management - Chest x-ray - CT in 12 months  Chilton Greathouse MD [facility] Pager [phone] 03/12/2020, 9:14 AM  CC: [facility]*

## 2020-03-12 NOTE — Addendum Note (Signed)
Addended by: Jacquiline Doe on: 03/12/2020 09:43 AM   Modules accepted: Orders

## 2020-03-12 NOTE — Addendum Note (Signed)
Addended by: Jacquiline Doe on: 03/12/2020 02:20 PM   Modules accepted: Orders

## 2020-03-13 ENCOUNTER — Telehealth: Payer: Self-pay | Admitting: Pharmacist

## 2020-03-13 NOTE — Telephone Encounter (Signed)
Melanie Greathouse, MD  P Rx Rheum/Pulm Can you please run a test claim to see which inhalers are affordable. I like her to be on triple inhaler therapy. She is currently on Stiolto which is too expensive [$100]. Thanks

## 2020-03-13 NOTE — Telephone Encounter (Signed)
Ran test claims for 1 month supply:  Trelegy- $412.00 (preferred) Breztri- $412.00  Stiolto- is [facility]- they have not filled since 2019. Anoro- $412.00 Bevespi- $412.00  *patient has a $365.00 unmet pharmacy deductible that is applying to all claims. Once met, copays will be $47.00.  Patient can apply for patient assistance. [employer]-Trelegy- would require patient to meet household income guidelines and household must have spent $600 OOP in 2021 on prescriptions. [facility]- only requires patient to meet household income guidelines.

## 2020-03-21 ENCOUNTER — Other Ambulatory Visit: Payer: Self-pay

## 2020-03-21 ENCOUNTER — Ambulatory Visit (INDEPENDENT_AMBULATORY_CARE_PROVIDER_SITE_OTHER): Payer: Medicare Other

## 2020-03-21 DIAGNOSIS — J449 Chronic obstructive pulmonary disease, unspecified: Secondary | ICD-10-CM

## 2020-03-28 ENCOUNTER — Telehealth: Payer: Self-pay | Admitting: Pulmonary Disease

## 2020-03-28 NOTE — Telephone Encounter (Signed)
Spoke with the pt and notified of recs per Dr Isaiah Serge  Per last note pt can apply for pt assistance for [facility]  Forms mailed to her and she will return once done

## 2020-03-28 NOTE — Telephone Encounter (Signed)
Chest x-ray shows changes of bronchitis.  There is no acute abnormality  See previous note about test claim for her inhalers Looks like she is not using Stiolto or any other inhaler due to cost Can we check if she is a candidate for patient assistance for [employer]

## 2020-03-28 NOTE — Telephone Encounter (Signed)
Patient calling for results of recent CXR. Please advise. 

## 2020-12-02 IMAGING — DX DG CHEST 2V
2 series · 2 of 2 positions shown · non-contrast
Comparison: 09/21/2017

CLINICAL DATA: Shortness of breath

EXAM:
CHEST - 2 VIEW

[chest pa]
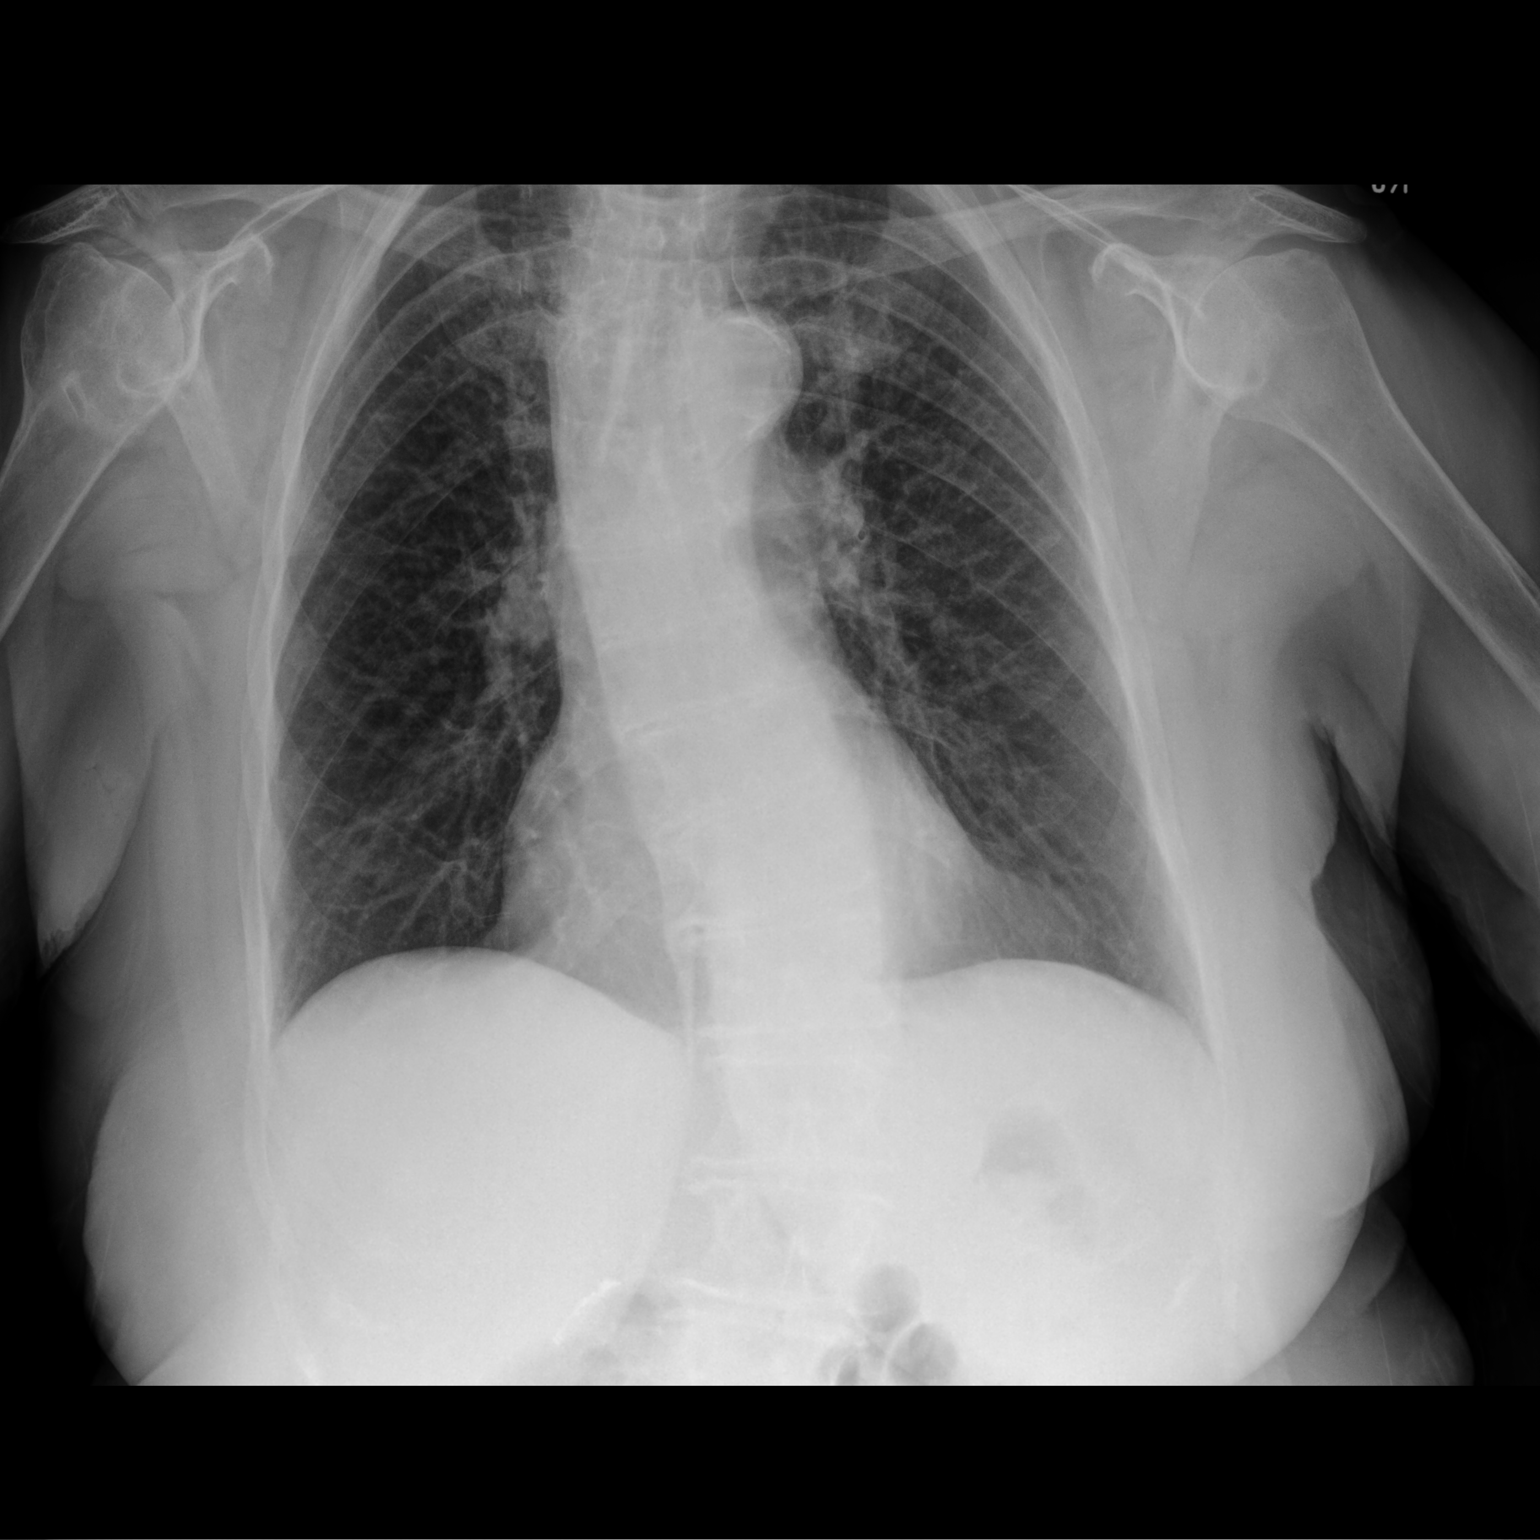

[chest lat]
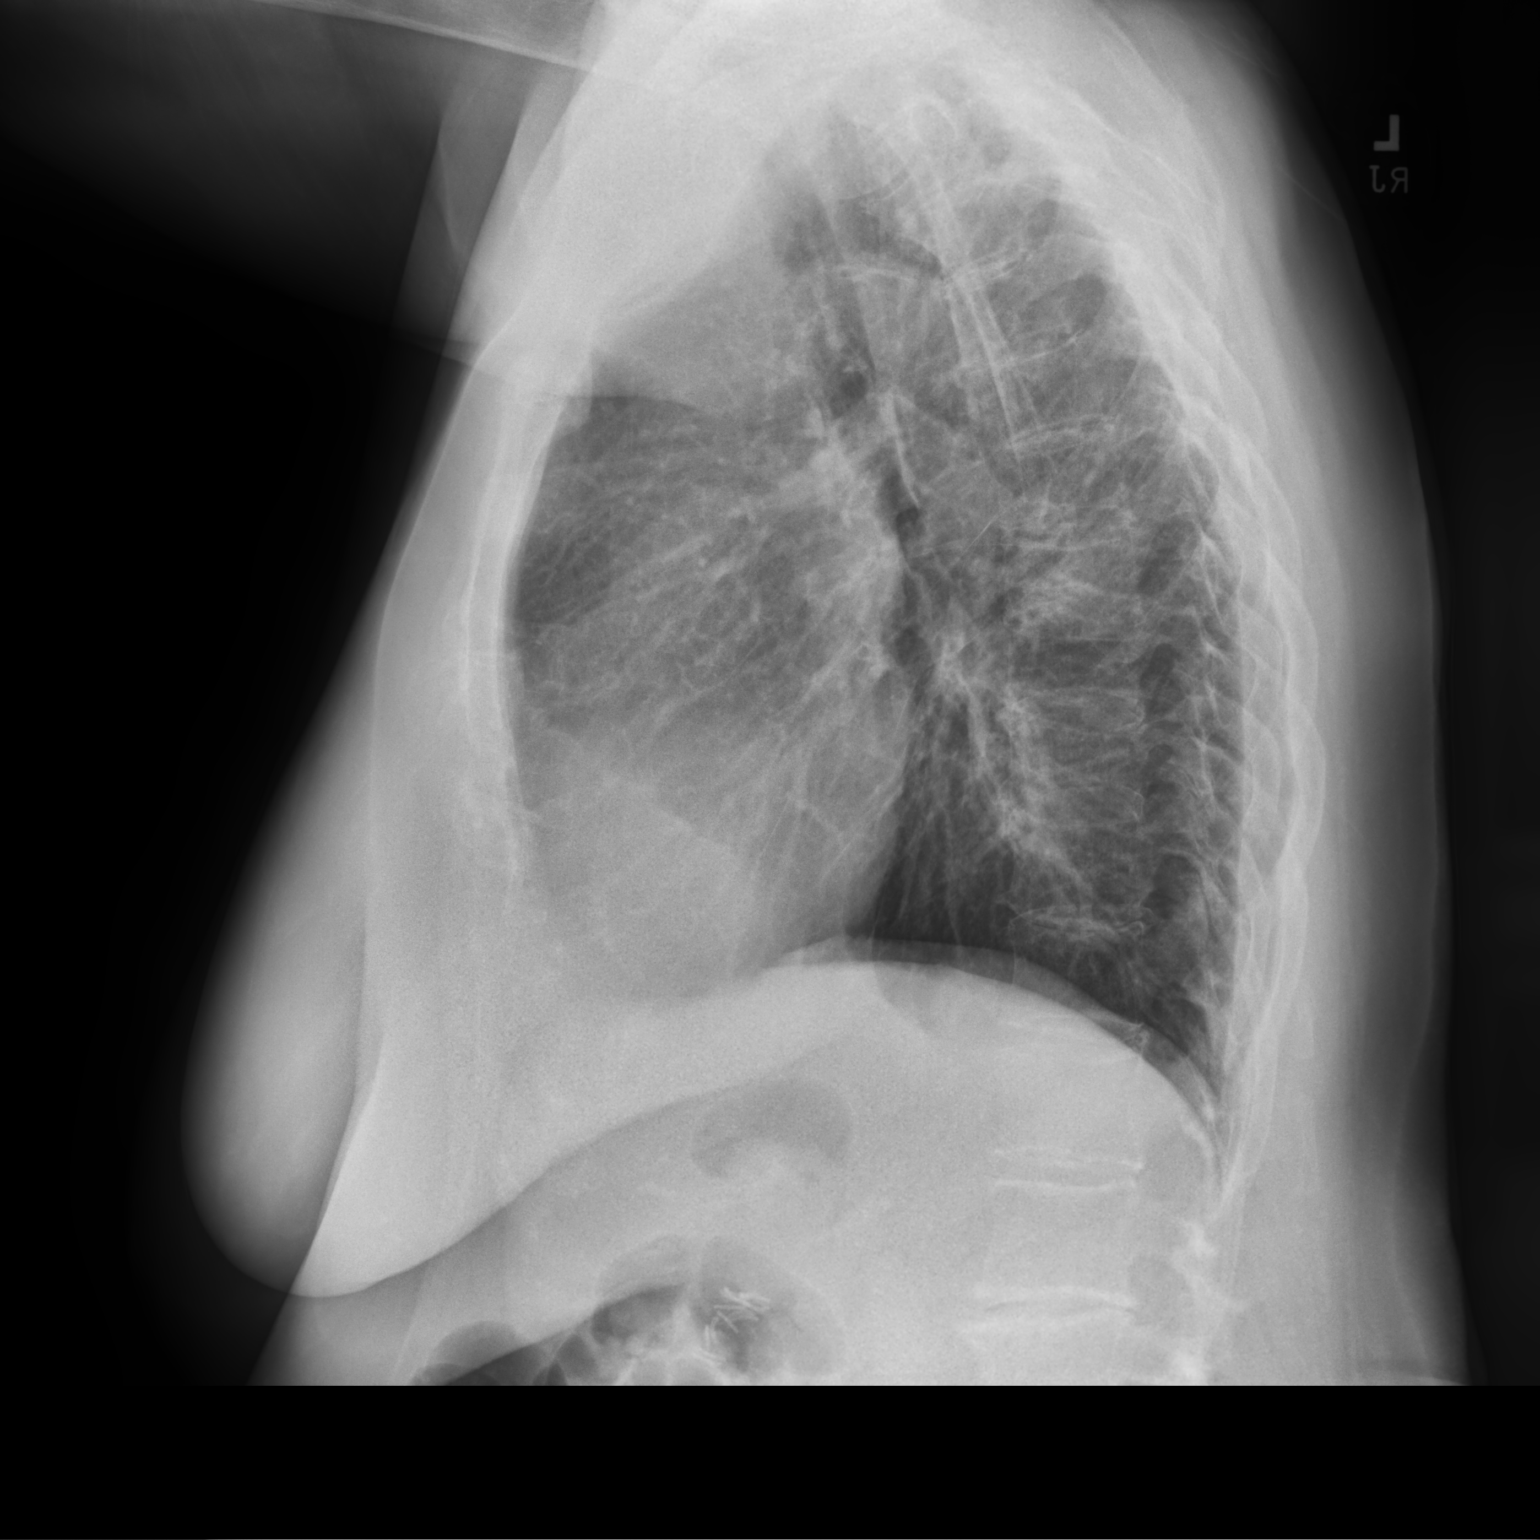

[2 of 2 positions shown; findings below may reference images not displayed]

FINDINGS: Thoracolumbar scoliosis. Aortic atherosclerosis. Heart is normal
size. Lungs clear. No effusions. No acute bony abnormality. Mild
peribronchial thickening.
IMPRESSION: Mild bronchitic changes.

Aortic atherosclerosis.

## 2022-07-12 ENCOUNTER — Ambulatory Visit: Payer: Medicare Other | Admitting: Pulmonary Disease

## 2022-10-29 DEATH — deceased
